# Patient Record
Sex: Male | Born: 1950
Health system: Southern US, Community
[De-identification: ages and names within clinical notes are randomized; demographics above are authoritative.]

## PROBLEM LIST (undated history)

## (undated) DIAGNOSIS — E785 Hyperlipidemia, unspecified: Secondary | ICD-10-CM

## (undated) DIAGNOSIS — F1020 Alcohol dependence, uncomplicated: Secondary | ICD-10-CM

## (undated) DIAGNOSIS — K635 Polyp of colon: Secondary | ICD-10-CM

## (undated) DIAGNOSIS — T7840XA Allergy, unspecified, initial encounter: Secondary | ICD-10-CM

## (undated) DIAGNOSIS — Z8619 Personal history of other infectious and parasitic diseases: Secondary | ICD-10-CM

## (undated) HISTORY — DX: Allergy, unspecified, initial encounter: T78.40XA

## (undated) HISTORY — PX: WISDOM TOOTH EXTRACTION: SHX21

## (undated) HISTORY — PX: HERNIA REPAIR: SHX51

## (undated) HISTORY — DX: Polyp of colon: K63.5

## (undated) HISTORY — DX: Alcohol dependence, uncomplicated: F10.20

## (undated) HISTORY — DX: Hyperlipidemia, unspecified: E78.5

## (undated) HISTORY — DX: Personal history of other infectious and parasitic diseases: Z86.19

---

## 2017-06-02 ENCOUNTER — Encounter: Payer: Self-pay | Admitting: Family Medicine

## 2017-06-02 ENCOUNTER — Ambulatory Visit (INDEPENDENT_AMBULATORY_CARE_PROVIDER_SITE_OTHER): Payer: Medicare PPO | Admitting: Family Medicine

## 2017-06-02 VITALS — BP 110/56 | HR 76 | Temp 97.7°F | Ht 69.75 in | Wt 169.6 lb

## 2017-06-02 DIAGNOSIS — M19042 Primary osteoarthritis, left hand: Secondary | ICD-10-CM

## 2017-06-02 DIAGNOSIS — M19041 Primary osteoarthritis, right hand: Secondary | ICD-10-CM

## 2017-06-02 DIAGNOSIS — E785 Hyperlipidemia, unspecified: Secondary | ICD-10-CM

## 2017-06-02 NOTE — Patient Instructions (Addendum)
When your hands get particularly painful, Ibuprofen 400-600 mg (2-3 over the counter strength tabs) every 6 hours as needed for pain.   There are options for your hands. Let us know if you are interested in these.

## 2017-06-02 NOTE — Progress Notes (Signed)
Chief Complaint  Patient presents with  . Establish Care       New Patient Visit SUBJECTIVE: HPI: Ray Stein is an 66 y.o.male who is being seen for establishing care.  The patient was previously seen at office in [place].  Sober since 1983. Former smoker, quit 5 mo ago. Smoked 1 cigar daily for 3 years. His last tetanus shot was 08/08/08. His last colonoscopy was in 2013. He did have some benign polyps found in 2003 and routine follow-up was suggested. He had the shingles vaccine (Zostavax) in 2014. He has a history of hyperlipidemia and used to be on Crestor. He has not had labs since 2016. He does have those labs with him today. His diet is fair. The patient currently takes no medications routinely. He has not had any pneumonia vaccinations.  He does have a history of pain in both of his hands. His third and fourth finger do not extend fully on the right or left, worse on the right. He takes ibuprofen intermittently for this that does seem to help somewhat.  Allergies  Allergen Reactions  . Penicillins Rash    Past Medical History:  Diagnosis Date  . Alcohol addiction (HCC)   . Allergy   . Colon polyps    Benign, next colonoscopy due in 2023  . History of chicken pox   . Hyperlipidemia    Past Surgical History:  Procedure Laterality Date  . HERNIA REPAIR    . WISDOM TOOTH EXTRACTION     Social History   Social History  . Marital status: Married   Social History Main Topics  . Smoking status: Former Smoker    Types: Cigars    Quit date: 12/02/2016  . Smokeless tobacco: Former NeurosurgeonUser    Types: Snuff    Quit date: 12/02/2016  . Alcohol use No  . Drug use: No   Family History  Problem Relation Age of Onset  . Arthritis Mother   . Diabetes Mother   . Diabetes Father   . Cancer Maternal Grandfather        Colon    Takes no medications routinely.  ROS MSK: +hand pain   OBJECTIVE: BP (!) 110/56 (BP Location: Left Arm, Patient Position: Sitting, Cuff Size:  Normal)   Pulse 76   Temp 97.7 F (36.5 C) (Oral)   Ht 5' 9.75" (1.772 m)   Wt 169 lb 9.6 oz (76.9 kg)   SpO2 96%   BMI 24.51 kg/m   Constitutional: -  VS reviewed -  Well developed, well nourished, appears stated age -  No apparent distress  Psychiatric: -  Oriented to person, place, and time -  Memory intact -  Affect and mood normal -  Fluent conversation, good eye contact -  Judgment and insight age appropriate  Eye: -  Conjunctivae clear, no discharge -  Pupils symmetric, round, reactive to light  ENMT: -  Ears are patent b/l without erythema or discharge. TM's are shiny and clear b/l without evidence of effusion or infection. -  Oral mucosa without lesions, tongue and uvula midline    Tonsils not enlarged, no erythema, no exudate, trachea midline    Pharynx moist, no lesions, no erythema  Neck: -  No gross swelling, no palpable masses -  Thyroid midline, not enlarged, mobile, no palpable masses  Cardiovascular: -  RRR, no murmurs -  No LE edema  Respiratory: -  Normal respiratory effort, no accessory muscle use, no retraction -  Breath sounds equal,  no wheezes, no ronchi, no crackles  Gastrointestinal: -  Bowel sounds normal -  No tenderness, no distention, no guarding, no masses  Neurological:  -  CN II - XII grossly intact -  Sensation grossly intact to light touch, equal bilaterally  Musculoskeletal: -  PIP and DIP joint hypertrophy of b/l hands -  Contractures of flexor tendons of 3rd and 4th digits on R and L hand, painful when extending the digits  Skin: -  No significant lesion on inspection -  Warm and dry to palpation   ASSESSMENT/PLAN: Hyperlipidemia, unspecified hyperlipidemia type  Arthritis of both hands  Patient instructed to sign release of records form from his previous PCP. F/u at earliest convenience for CPE. He will need routine lab work, PCV 13, and Shingrix. Discussed options regarding his hands including doing nothing, occupational therapy,  and referral to hand surgery. He opted for the initial option for now. He will let us know if things change. The patient voiced understanding and agreement to the plan.   Jilda Roche [place], DO 06/02/17  5:30 PM

## 2017-06-30 ENCOUNTER — Encounter: Payer: Self-pay | Admitting: Family Medicine

## 2017-06-30 ENCOUNTER — Ambulatory Visit (INDEPENDENT_AMBULATORY_CARE_PROVIDER_SITE_OTHER): Payer: Medicare PPO | Admitting: Family Medicine

## 2017-06-30 VITALS — BP 124/70 | HR 68 | Temp 97.8°F | Ht 70.0 in | Wt 168.2 lb

## 2017-06-30 DIAGNOSIS — Z1159 Encounter for screening for other viral diseases: Secondary | ICD-10-CM | POA: Diagnosis not present

## 2017-06-30 DIAGNOSIS — Z114 Encounter for screening for human immunodeficiency virus [HIV]: Secondary | ICD-10-CM

## 2017-06-30 DIAGNOSIS — Z23 Encounter for immunization: Secondary | ICD-10-CM

## 2017-06-30 DIAGNOSIS — Z0000 Encounter for general adult medical examination without abnormal findings: Secondary | ICD-10-CM | POA: Diagnosis not present

## 2017-06-30 DIAGNOSIS — Z125 Encounter for screening for malignant neoplasm of prostate: Secondary | ICD-10-CM | POA: Diagnosis not present

## 2017-06-30 NOTE — Progress Notes (Signed)
Pre visit review using our clinic review tool, if applicable. No additional management support is needed unless otherwise documented below in the visit note. 

## 2017-06-30 NOTE — Progress Notes (Signed)
Chief Complaint  Patient presents with  . Annual Exam    Well Male Elyn PeersDouglas Moraes is here for a complete physical.   His last physical was >1 year ago.  Current diet: in general, a "healthy" diet   Current exercise: walking daily Weight trend: stable Does pt snore? No. Daytime fatigue? No. Seat belt? Yes.    Health maintenance Shingrix- No Colonoscopy- Yes - due in 5 years Tetanus- Yes Hep C- No Prostate cancer screening- Yes Pneumonia vaccine- No  Past Medical History:  Diagnosis Date  . Alcohol addiction (HCC)   . Allergy   . Colon polyps    Benign, next colonoscopy due in 2023  . History of chicken pox   . Hyperlipidemia     Past Surgical History:  Procedure Laterality Date  . HERNIA REPAIR    . WISDOM TOOTH EXTRACTION     Medications  Takes no medications routinely.    Allergies Allergies  Allergen Reactions  . Penicillins Rash   Family History Family History  Problem Relation Age of Onset  . Arthritis Mother   . Diabetes Mother   . Diabetes Father   . Cancer Maternal Grandfather        Colon    Review of Systems: Constitutional:  no unexpected change in weight, no fevers or chills Eye:  no recent significant change in vision Ear/Nose/Mouth/Throat:  Ears:  no tinnitus or hearing loss Nose/Mouth/Throat:  no complaints of nasal congestion or bleeding, no sore throat and oral sores Cardiovascular:  no chest pain, no palpitations Respiratory:  no cough and no shortness of breath Gastrointestinal:  no abdominal pain, no change in bowel habits, no nausea, vomiting, diarrhea, or constipation and no black or bloody stool GU:  Male: negative for dysuria, frequency, and incontinence and negative for prostate symptoms Musculoskeletal/Extremities: +intermittent LBP, otherwise no pain, redness, or swelling of the joints Integumentary (Skin):  no abnormal skin lesions reported Neurologic:  no headaches, no numbness, tingling Endocrine:  No weight changes,  masses in the neck, heat/cold intolerance, bowel or skin changes, or cardiovascular system symptoms Hematologic/Lymphatic:  no abnormal bleeding, no night sweats, no weight loss  Exam BP 124/70 (BP Location: Left Arm, Patient Position: Sitting, Cuff Size: Normal)   Pulse 68   Temp 97.8 F (36.6 C) (Oral)   Ht 5\' 10"  (1.778 m)   Wt 168 lb 4 oz (76.3 kg)   SpO2 97%   BMI 24.14 kg/m  General:  well developed, well nourished, in no apparent distress Skin:  no significant moles, warts, or growths Head:  no masses, lesions, or tenderness Eyes:  pupils equal and round, sclera anicteric without injection Ears:  canals without lesions, TMs shiny without retraction, no obvious effusion, no erythema Nose:  nares patent, septum midline, mucosa normal Throat/Pharynx:  lips and gingiva without lesion; tongue and uvula midline; non-inflamed pharynx; no exudates or postnasal drainage Neck: neck supple without adenopathy, thyromegaly, or masses Lungs:  clear to auscultation, breath sounds equal bilaterally, no respiratory distress Cardio:  regular rate and rhythm without murmurs, heart sounds without clicks or rubs Abdomen:  abdomen soft, nontender; bowel sounds normal; no masses or organomegaly Genital (male): circumcised penis, no lesions or discharge; testes present bilaterally without masses or tenderness Rectal: Deferred Musculoskeletal:  symmetrical muscle groups noted without atrophy or deformity Extremities:  no clubbing, cyanosis, or edema, no deformities, no skin discoloration Neuro:  gait normal; deep tendon reflexes normal and symmetric Psych: well oriented with normal range of affect and appropriate  judgment/insight  Assessment and Plan  Well adult exam - Plan: CBC, Lipid panel, Comprehensive metabolic panel  Encounter for screening for HIV - Plan: HIV antibody  Need for hepatitis C screening test - Plan: Hepatitis C antibody  Screening for prostate cancer - Plan: PSA   Well 66  y.o. male. Great work fasting this entire day. Counseled on diet and exercise. Challenged him to lift weights. He will check on the AAA screening as he may have had it in [place]. Other orders as above. Home stretches and exercises given for low back pain. It is not bothering him much right now.  Follow up in 6 mo for AWV, 1 yr with me or prn, pending above.  The patient voiced understanding and agreement to the plan.  Jilda Roche [place], DO 06/30/17 3:13 PM

## 2017-06-30 NOTE — Addendum Note (Signed)
Addended by: Scharlene GlossEWING, Kadeshia Kasparian B on: 06/30/2017 03:26 PM   Modules accepted: Orders

## 2017-06-30 NOTE — Patient Instructions (Signed)
EXERCISES  RANGE OF MOTION (ROM) AND STRETCHING EXERCISES - Low Back Sprain Most people with lower back pain will find that their symptoms get worse with excessive bending forward (flexion) or arching at the lower back (extension). The exercises that will help resolve your symptoms will focus on the opposite motion.  Your physician, physical therapist or athletic trainer will help you determine which exercises will be most helpful to resolve your lower back pain. Do not complete any exercises without first consulting with your caregiver. Discontinue any exercises which make your symptoms worse, until you speak to your caregiver. If you have pain, numbness or tingling which travels down into your buttocks, leg or foot, the goal of the therapy is for these symptoms to move closer to your back and eventually resolve. Sometimes, these leg symptoms will get better, but your lower back pain may worsen. This is often an indication of progress in your rehabilitation. Be very alert to any changes in your symptoms and the activities in which you participated in the 24 hours prior to the change. Sharing this information with your caregiver will allow him or her to most efficiently treat your condition. These exercises may help you when beginning to rehabilitate your injury. Your symptoms may resolve with or without further involvement from your physician, physical therapist or athletic trainer. While completing these exercises, remember:   Restoring tissue flexibility helps normal motion to return to the joints. This allows healthier, less painful movement and activity.  An effective stretch should be held for at least 30 seconds.  A stretch should never be painful. You should only feel a gentle lengthening or release in the stretched tissue. FLEXION RANGE OF MOTION AND STRETCHING EXERCISES:  STRETCH - Flexion, Single Knee to Chest   Lie on a firm bed or floor with both legs extended in front of you.  Keeping  one leg in contact with the floor, bring your opposite knee to your chest. Hold your leg in place by either grabbing behind your thigh or at your knee.  Pull until you feel a gentle stretch in your low back. Hold 15-20 seconds.  Slowly release your grasp and repeat the exercise with the opposite side. Repeat 2 times. Complete this exercise 1-2 times per day.   STRETCH - Flexion, Double Knee to Chest  Lie on a firm bed or floor with both legs extended in front of you.  Keeping one leg in contact with the floor, bring your opposite knee to your chest.  Tense your stomach muscles to support your back and then lift your other knee to your chest. Hold your legs in place by either grabbing behind your thighs or at your knees.  Pull both knees toward your chest until you feel a gentle stretch in your low back. Hold 15-20 seconds.  Tense your stomach muscles and slowly return one leg at a time to the floor. Repeat 2 times. Complete this exercise 1-2 times per day.   STRETCH - Low Trunk Rotation  Lie on a firm bed or floor. Keeping your legs in front of you, bend your knees so they are both pointed toward the ceiling and your feet are flat on the floor.  Extend your arms out to the side. This will stabilize your upper body by keeping your shoulders in contact with the floor.  Gently and slowly drop both knees together to one side until you feel a gentle stretch in your low back. Hold for 15-20 seconds.  Tense your   stomach muscles to support your lower back as you bring your knees back to the starting position. Repeat the exercise to the other side. Repeat 2 times. Complete this exercise 1-2 times per day  EXTENSION RANGE OF MOTION AND FLEXIBILITY EXERCISES:  STRETCH - Extension, Prone on Elbows   Lie on your stomach on the floor, a bed will be too soft. Place your palms about shoulder width apart and at the height of your head.  Place your elbows under your shoulders. If this is too  painful, stack pillows under your chest.  Allow your body to relax so that your hips drop lower and make contact more completely with the floor.  Hold this position for 15-20 seconds.  Slowly return to lying flat on the floor. Repeat 2 times. Complete this exercise 1-2 times per day.   RANGE OF MOTION - Extension, Prone Press Ups  Lie on your stomach on the floor, a bed will be too soft. Place your palms about shoulder width apart and at the height of your head.  Keeping your back as relaxed as possible, slowly straighten your elbows while keeping your hips on the floor. You may adjust the placement of your hands to maximize your comfort. As you gain motion, your hands will come more underneath your shoulders.  Hold this position 15-20 seconds.  Slowly return to lying flat on the floor. Repeat 2 times. Complete this exercise 1-2 times per day.   RANGE OF MOTION- Quadruped, Neutral Spine   Assume a hands and knees position on a firm surface. Keep your hands under your shoulders and your knees under your hips. You may place padding under your knees for comfort.  Drop your head and point your tailbone toward the ground below you. This will round out your lower back like an angry cat. Hold this position for 15-20 seconds.  Slowly lift your head and release your tail bone so that your back sags into a large arch, like an old horse.  Hold this position for 15-20 seconds.  Repeat this until you feel limber in your low back.  Now, find your "sweet spot." This will be the most comfortable position somewhere between the two previous positions. This is your neutral spine. Once you have found this position, tense your stomach muscles to support your low back.  Hold this position for 15-20 seconds. Repeat 2 times. Complete this exercise 1-2 times per day.  STRENGTHENING EXERCISES - Low Back Sprain These exercises may help you when beginning to rehabilitate your injury. These exercises should  be done near your "sweet spot." This is the neutral, low-back arch, somewhere between fully rounded and fully arched, that is your least painful position. When performed in this safe range of motion, these exercises can be used for people who have either a flexion or extension based injury. These exercises may resolve your symptoms with or without further involvement from your physician, physical therapist or athletic trainer. While completing these exercises, remember:   Muscles can gain both the endurance and the strength needed for everyday activities through controlled exercises.  Complete these exercises as instructed by your physician, physical therapist or athletic trainer. Increase the resistance and repetitions only as guided.  You may experience muscle soreness or fatigue, but the pain or discomfort you are trying to eliminate should never worsen during these exercises. If this pain does worsen, stop and make certain you are following the directions exactly. If the pain is still present after adjustments, discontinue the   exercise until you can discuss the trouble with your caregiver.  STRENGTHENING - Deep Abdominals, Pelvic Tilt   Lie on a firm bed or floor. Keeping your legs in front of you, bend your knees so they are both pointed toward the ceiling and your feet are flat on the floor.  Tense your lower abdominal muscles to press your low back into the floor. This motion will rotate your pelvis so that your tail bone is scooping upwards rather than pointing at your feet or into the floor. With a gentle tension and even breathing, hold this position for 10-15 seconds. Repeat 2 times. Complete this exercise 1 time per day.   STRENGTHENING - Abdominals, Crunches   Lie on a firm bed or floor. Keeping your legs in front of you, bend your knees so they are both pointed toward the ceiling and your feet are flat on the floor. Cross your arms over your chest.  Slightly tip your chin down  without bending your neck.  Tense your abdominals and slowly lift your trunk high enough to just clear your shoulder blades. Lifting higher can put excessive stress on the lower back and does not further strengthen your abdominal muscles.  Control your return to the starting position. Repeat 2 times. Complete this exercise once every 1-2 days.   STRENGTHENING - Quadruped, Opposite UE/LE Lift   Assume a hands and knees position on a firm surface. Keep your hands under your shoulders and your knees under your hips. You may place padding under your knees for comfort.  Find your neutral spine and gently tense your abdominal muscles so that you can maintain this position. Your shoulders and hips should form a rectangle that is parallel with the floor and is not twisted.  Keeping your trunk steady, lift your right hand no higher than your shoulder and then your left leg no higher than your hip. Make sure you are not holding your breath. Hold this position for 15-20 seconds.  Continuing to keep your abdominal muscles tense and your back steady, slowly return to your starting position. Repeat with the opposite arm and leg. Repeat 2 times. Complete this exercise once every 1-2 days.   STRENGTHENING - Abdominals and Quadriceps, Straight Leg Raise   Lie on a firm bed or floor with both legs extended in front of you.  Keeping one leg in contact with the floor, bend the other knee so that your foot can rest flat on the floor.  Find your neutral spine, and tense your abdominal muscles to maintain your spinal position throughout the exercise.  Slowly lift your straight leg off the floor about 6 inches for a count of 15, making sure to not hold your breath.  Still keeping your neutral spine, slowly lower your leg all the way to the floor. Repeat this exercise with each leg 2 times. Complete this exercise once every 1-2 days. POSTURE AND BODY MECHANICS CONSIDERATIONS - Low Back Sprain Keeping correct  posture when sitting, standing or completing your activities will reduce the stress put on different body tissues, allowing injured tissues a chance to heal and limiting painful experiences. The following are general guidelines for improved posture. Your physician or physical therapist will provide you with any instructions specific to your needs. While reading these guidelines, remember:  The exercises prescribed by your provider will help you have the flexibility and strength to maintain correct postures.  The correct posture provides the best environment for your joints to work. All of your   joints have less wear and tear when properly supported by a spine with good posture. This means you will experience a healthier, less painful body.  Correct posture must be practiced with all of your activities, especially prolonged sitting and standing. Correct posture is as important when doing repetitive low-stress activities (typing) as it is when doing a single heavy-load activity (lifting).  RESTING POSITIONS Consider which positions are most painful for you when choosing a resting position. If you have pain with flexion-based activities (sitting, bending, stooping, squatting), choose a position that allows you to rest in a less flexed posture. You would want to avoid curling into a fetal position on your side. If your pain worsens with extension-based activities (prolonged standing, working overhead), avoid resting in an extended position such as sleeping on your stomach. Most people will find more comfort when they rest with their spine in a more neutral position, neither too rounded nor too arched. Lying on a non-sagging bed on your side with a pillow between your knees, or on your back with a pillow under your knees will often provide some relief. Keep in mind, being in any one position for a prolonged period of time, no matter how correct your posture, can still lead to stiffness. PROPER SITTING  POSTURE In order to minimize stress and discomfort on your spine, you must sit with correct posture. Sitting with good posture should be effortless for a healthy body. Returning to good posture is a gradual process. Many people can work toward this most comfortably by using various supports until they have the flexibility and strength to maintain this posture on their own. When sitting with proper posture, your ears will fall over your shoulders and your shoulders will fall over your hips. You should use the back of the chair to support your upper back. Your lower back will be in a neutral position, just slightly arched. You may place a small pillow or folded towel at the base of your lower back for  support.  When working at a desk, create an environment that supports good, upright posture. Without extra support, muscles tire, which leads to excessive strain on joints and other tissues. Keep these recommendations in mind:  CHAIR:  A chair should be able to slide under your desk when your back makes contact with the back of the chair. This allows you to work closely.  The chair's height should allow your eyes to be level with the upper part of your monitor and your hands to be slightly lower than your elbows.  BODY POSITION  Your feet should make contact with the floor. If this is not possible, use a foot rest.  Keep your ears over your shoulders. This will reduce stress on your neck and low back.  INCORRECT SITTING POSTURES  If you are feeling tired and unable to assume a healthy sitting posture, do not slouch or slump. This puts excessive strain on your back tissues, causing more damage and pain. Healthier options include:  Using more support, like a lumbar pillow.  Switching tasks to something that requires you to be upright or walking.  Talking a brief walk.  Lying down to rest in a neutral-spine position.  PROLONGED STANDING WHILE SLIGHTLY LEANING FORWARD  When completing a task  that requires you to lean forward while standing in one place for a long time, place either foot up on a stationary 2-4 inch high object to help maintain the best posture. When both feet are on the ground,   the lower back tends to lose its slight inward curve. If this curve flattens (or becomes too large), then the back and your other joints will experience too much stress, tire more quickly, and can cause pain.  CORRECT STANDING POSTURES Proper standing posture should be assumed with all daily activities, even if they only take a few moments, like when brushing your teeth. As in sitting, your ears should fall over your shoulders and your shoulders should fall over your hips. You should keep a slight tension in your abdominal muscles to brace your spine. Your tailbone should point down to the ground, not behind your body, resulting in an over-extended swayback posture.   INCORRECT STANDING POSTURES  Common incorrect standing postures include a forward head, locked knees and/or an excessive swayback. WALKING Walk with an upright posture. Your ears, shoulders and hips should all line-up.  PROLONGED ACTIVITY IN A FLEXED POSITION When completing a task that requires you to bend forward at your waist or lean over a low surface, try to find a way to stabilize 3 out of 4 of your limbs. You can place a hand or elbow on your thigh or rest a knee on the surface you are reaching across. This will provide you more stability, so that your muscles do not tire as quickly. By keeping your knees relaxed, or slightly bent, you will also reduce stress across your lower back. CORRECT LIFTING TECHNIQUES  DO :  Assume a wide stance. This will provide you more stability and the opportunity to get as close as possible to the object which you are lifting.  Tense your abdominals to brace your spine. Bend at the knees and hips. Keeping your back locked in a neutral-spine position, lift using your leg muscles. Lift with your  legs, keeping your back straight.  Test the weight of unknown objects before attempting to lift them.  Try to keep your elbows locked down at your sides in order get the best strength from your shoulders when carrying an object.     Always ask for help when lifting heavy or awkward objects. INCORRECT LIFTING TECHNIQUES DO NOT:   Lock your knees when lifting, even if it is a small object.  Bend and twist. Pivot at your feet or move your feet when needing to change directions.  Assume that you can safely pick up even a paperclip without proper posture.   

## 2017-07-01 LAB — CBC
HCT: 43 % (ref 39.0–52.0)
Hemoglobin: 14.6 g/dL (ref 13.0–17.0)
MCHC: 34 g/dL (ref 30.0–36.0)
MCV: 87.5 fl (ref 78.0–100.0)
PLATELETS: 318 10*3/uL (ref 150.0–400.0)
RBC: 4.91 Mil/uL (ref 4.22–5.81)
RDW: 14 % (ref 11.5–15.5)
WBC: 8.1 10*3/uL (ref 4.0–10.5)

## 2017-07-01 LAB — LIPID PANEL
CHOL/HDL RATIO: 6
CHOLESTEROL: 262 mg/dL — AB (ref 0–200)
HDL: 44.1 mg/dL (ref >39.00)
LDL Cholesterol: 187 mg/dL — ABNORMAL HIGH (ref 0–99)
NonHDL: 217.84
TRIGLYCERIDES: 152 mg/dL — AB (ref 0.0–149.0)
VLDL: 30.4 mg/dL (ref 0.0–40.0)

## 2017-07-01 LAB — COMPREHENSIVE METABOLIC PANEL
ALBUMIN: 4.2 g/dL (ref 3.5–5.2)
ALK PHOS: 68 U/L (ref 39–117)
ALT: 24 U/L (ref 0–53)
AST: 29 U/L (ref 0–37)
BILIRUBIN TOTAL: 0.5 mg/dL (ref 0.2–1.2)
BUN: 15 mg/dL (ref 6–23)
CALCIUM: 9.6 mg/dL (ref 8.4–10.5)
CO2: 28 meq/L (ref 19–32)
CREATININE: 1.01 mg/dL (ref 0.40–1.50)
Chloride: 102 mEq/L (ref 96–112)
GFR: 78.73 mL/min (ref >60.00)
Glucose, Bld: 76 mg/dL (ref 70–99)
Potassium: 3.8 mEq/L (ref 3.5–5.1)
Sodium: 137 mEq/L (ref 135–145)
TOTAL PROTEIN: 7.2 g/dL (ref 6.0–8.3)

## 2017-07-01 LAB — HIV ANTIBODY (ROUTINE TESTING W REFLEX): HIV: NONREACTIVE

## 2017-07-01 LAB — HEPATITIS C ANTIBODY: HCV AB: NEGATIVE

## 2017-07-01 LAB — PSA: PSA: 0.49 ng/mL (ref 0.10–4.00)

## 2017-07-03 ENCOUNTER — Other Ambulatory Visit: Payer: Self-pay | Admitting: Family Medicine

## 2017-07-03 MED ORDER — ATORVASTATIN CALCIUM 40 MG PO TABS: 40.0000 mg | ORAL_TABLET | Freq: Every day | ORAL | 1 refills | Status: DC

## 2017-07-03 NOTE — Progress Notes (Signed)
Lipitor sent to pharmacy.

## 2017-09-29 ENCOUNTER — Ambulatory Visit: Payer: Self-pay | Admitting: Family Medicine

## 2017-10-01 ENCOUNTER — Encounter: Payer: Self-pay | Admitting: Family Medicine

## 2017-10-01 ENCOUNTER — Ambulatory Visit (INDEPENDENT_AMBULATORY_CARE_PROVIDER_SITE_OTHER): Payer: Medicare PPO | Admitting: Family Medicine

## 2017-10-01 VITALS — BP 120/64 | HR 60 | Temp 97.8°F | Ht 70.0 in | Wt 172.2 lb

## 2017-10-01 DIAGNOSIS — E785 Hyperlipidemia, unspecified: Secondary | ICD-10-CM | POA: Diagnosis not present

## 2017-10-01 DIAGNOSIS — Z23 Encounter for immunization: Secondary | ICD-10-CM

## 2017-10-01 NOTE — Progress Notes (Signed)
Chief Complaint  Patient presents with  . Hyperlipidemia    Subjective: Dyslipidemia Patient presents for dyslipidemia follow up. Compliance with treatment thus far has been good. He denies myalgias. He does not use medications that may worsen dyslipidemias (corticosteroids, progestins, anabolic steroids, diuretics, beta-blockers, amiodarone, cyclosporine, olanzapine). He is adhering to a healthy diet. The patient exercises walking every day.  The patient is not known to have coexisting coronary artery disease.  ROS: Heart: Denies chest pain or palpitations Lungs: Denies SOB or cough  Family History  Problem Relation Age of Onset  . Arthritis Mother   . Diabetes Mother   . Diabetes Father   . Cancer Maternal Grandfather        Colon   Past Medical History:  Diagnosis Date  . Alcohol addiction (HCC)   . Allergy   . Colon polyps    Benign, next colonoscopy due in 2023  . History of chicken pox   . Hyperlipidemia    Allergies  Allergen Reactions  . Penicillins Rash    Current Outpatient Prescriptions:  .  atorvastatin (LIPITOR) 40 MG tablet, Take 1 tablet (40 mg total) by mouth daily., Disp: 90 tablet, Rfl: 1  Objective: BP 120/64 (BP Location: Right Arm, Patient Position: Sitting, Cuff Size: Normal)   Pulse 60   Temp 97.8 F (36.6 C) (Oral)   Ht  (1.778 m)   Wt 172 lb 4 oz (78.1 kg)   SpO2 95%   BMI 24.72 kg/m  General: Awake, appears stated age HEENT: MMM, EOMi Heart: RRR, no bruits Lungs: CTAB, no rales, wheezes or rhonchi. No accessory muscle use Psych: Age appropriate judgment and insight, normal affect and mood  Assessment and Plan: Hyperlipidemia, unspecified hyperlipidemia type - Plan: Lipid panel, Hepatic function panel  Need for influenza vaccination - Plan: Flu vaccine HIGH DOSE PF (Fluzone High dose)  Orders as above. Appears to be doing well.  Counseled on diet and exercise, though it appears he may be fighting against genetics. Cont  on Lipitor.  F/u as originally scheduled in 9 mo. The patient voiced understanding and agreement to the plan.  Jilda Roche [place], DO 10/01/17  3:05 PM

## 2017-10-01 NOTE — Progress Notes (Signed)
Pre visit review using our clinic review tool, if applicable. No additional management support is needed unless otherwise documented below in the visit note. 

## 2017-10-01 NOTE — Patient Instructions (Addendum)
Give [place] 2-3 business days to get the results of your labs back. If things look good, I will see you in 9 mo as originally scheduled. We are here if you need [place] in the meantime.  Let us know if you need anything.

## 2017-10-02 ENCOUNTER — Other Ambulatory Visit: Payer: Self-pay | Admitting: Family Medicine

## 2017-10-02 DIAGNOSIS — R7401 Elevation of levels of liver transaminase levels: Secondary | ICD-10-CM

## 2017-10-02 DIAGNOSIS — R740 Nonspecific elevation of levels of transaminase and lactic acid dehydrogenase [LDH]: Principal | ICD-10-CM

## 2017-10-02 LAB — LIPID PANEL
CHOLESTEROL: 151 mg/dL (ref 0–200)
HDL: 44.3 mg/dL (ref >39.00)
LDL Cholesterol: 87 mg/dL (ref 0–99)
NONHDL: 106.53
Total CHOL/HDL Ratio: 3
Triglycerides: 96 mg/dL (ref 0.0–149.0)
VLDL: 19.2 mg/dL (ref 0.0–40.0)

## 2017-10-02 LAB — HEPATIC FUNCTION PANEL
ALK PHOS: 93 U/L (ref 39–117)
ALT: 49 U/L (ref 0–53)
AST: 39 U/L — AB (ref 0–37)
Albumin: 4 g/dL (ref 3.5–5.2)
BILIRUBIN TOTAL: 0.5 mg/dL (ref 0.2–1.2)
Bilirubin, Direct: 0.1 mg/dL (ref 0.0–0.3)
Total Protein: 6.8 g/dL (ref 6.0–8.3)

## 2017-10-06 ENCOUNTER — Other Ambulatory Visit (INDEPENDENT_AMBULATORY_CARE_PROVIDER_SITE_OTHER): Payer: Medicare PPO

## 2017-10-06 DIAGNOSIS — R740 Nonspecific elevation of levels of transaminase and lactic acid dehydrogenase [LDH]: Secondary | ICD-10-CM

## 2017-10-06 DIAGNOSIS — R7401 Elevation of levels of liver transaminase levels: Secondary | ICD-10-CM

## 2017-10-06 LAB — HEPATIC FUNCTION PANEL
ALT: 36 U/L (ref 0–53)
AST: 28 U/L (ref 0–37)
Albumin: 4 g/dL (ref 3.5–5.2)
Alkaline Phosphatase: 87 U/L (ref 39–117)
BILIRUBIN DIRECT: 0.1 mg/dL (ref 0.0–0.3)
TOTAL PROTEIN: 6.8 g/dL (ref 6.0–8.3)
Total Bilirubin: 0.4 mg/dL (ref 0.2–1.2)

## 2017-12-10 NOTE — Progress Notes (Signed)
Subjective:   Ray Stein is a 66 y.o. male who presents for an Initial Medicare Annual Wellness Visit. 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  Review of Systems  No ROS.  Medicare Wellness Visit. Additional risk factors are reflected in the social history. Cardiac Risk Factors include: advanced age (>62men, >15 women) Sleep patterns: Sleeps 6-8 hrs per night. Mostly feels rested.  Home Safety/Smoke Alarms: Feels safe in home. Smoke alarms in place. Lives with wife and dog.  Male:   [facility]- pt reports last done in [place] in 2015. Reports normal w/ recall 5 yrs. Family hx of colon cancer.     PSA-  Lab Results  Component Value Date   PSA 0.49 06/30/2017      Objective:    Today's Vitals   12/12/17 1417  BP: (!) 102/58  Pulse: 66  SpO2: 96%  Weight: 169 lb 9.6 oz (76.9 kg)  Height: 5\' 10"  (1.778 m)   Body mass index is 24.34 kg/m.  Advanced Directives 12/12/2017  Does Patient Have a Medical Advance Directive? No    Current Medications (verified) Outpatient Encounter Medications as of 12/12/2017  Medication Sig  . atorvastatin (LIPITOR) 40 MG tablet Take 1 tablet (40 mg total) by mouth daily.   No facility-administered encounter medications on file as of 12/12/2017.     Allergies (verified) Penicillins   History: Past Medical History:  Diagnosis Date  . Alcohol addiction (HCC)   . Allergy   . Colon polyps    Benign, next colonoscopy due in 2023  . History of chicken pox   . Hyperlipidemia    Past Surgical History:  Procedure Laterality Date  . HERNIA REPAIR    . WISDOM TOOTH EXTRACTION     Family History  Problem Relation Age of Onset  . Arthritis Mother   . Diabetes Mother   . Diabetes Father   . Cancer Maternal Grandfather        Colon   Social History   Socioeconomic History  . Marital status: Married    Spouse name: None  . Number of children: None  . Years of education: None  . Highest education level: None  Social  Needs  . Financial resource strain: None  . Food insecurity - worry: None  . Food insecurity - inability: None  . Transportation needs - medical: None  . Transportation needs - non-medical: None  Occupational History  . None  Tobacco Use  . Smoking status: Current Some Day Smoker    Types: Cigars    Start date: 08/2014    Last attempt to quit: 12/30/2016    Years since quitting: 0.9  . Smokeless tobacco: Former Neurosurgeon    Types: Snuff    Quit date: 12/02/2016  Substance and Sexual Activity  . Alcohol use: No  . Drug use: No  . Sexual activity: Yes  Other Topics Concern  . None  Social History Narrative  . None   Tobacco Counseling Ready to quit: Not Answered Counseling given: Not Answered    Activities of Daily Living In your present state of health, do you have any difficulty performing the following activities: 12/12/2017 06/02/2017  Hearing? N N  Vision? N N  Comment wearing glasses. Goes to eye doctor as needed. -  Difficulty concentrating or making decisions? N N  Walking or climbing stairs? N N  Dressing or bathing? N N  Doing errands, shopping? N N  Preparing Food and eating ? N -  Using the Toilet? N -  In the past six months, have you accidently leaked urine? N -  Do you have problems with loss of bowel control? N -  Managing your Medications? N -  Managing your Finances? N -  Housekeeping or managing your Housekeeping? N -  Some recent data might be hidden      Immunizations and Health Maintenance Immunization History  Administered Date(s) Administered  . Hep A / Hep B 03/12/2004, 04/19/2004  . Hepatitis A, Adult 09/12/2004  . Influenza, High Dose Seasonal PF 10/01/2017  . Influenza-Unspecified 11/13/2011, 09/13/2013, 10/30/2016  . Pneumococcal Conjugate-13 06/30/2017  . Td 12/31/1991  . Tdap 08/08/2008  . Zoster 11/03/2015   There are no preventive care reminders to display for this patient.  Patient Care Team: Sharlene DoryWendling, Nicholas Paul, DO as PCP -  General (Family Medicine)  Indicate any recent Medical Services you may have received from other than [facility] providers in the past year (date may be approximate).    Assessment:   This is a routine wellness examination for Ray Stein. Physical assessment deferred to PCP.  Hearing/Vision screen Hearing Screening Comments: Able to hear conversational tones w/o difficulty. No issues reported.  Passed whisper test Vision Screening Comments: Pt reports eye exam every 2 yrs. No issues reported   Dietary issues and exercise activities discussed: Current Exercise Habits: Home exercise routine, Type of exercise: walking, Time (Minutes): 30, Frequency (Times/Week): 7, Weekly Exercise (Minutes/Week): 210  Goals    . Maintain current health (pt-stated)      Depression Screen PHQ 2/9 Scores 12/12/2017 06/02/2017  PHQ - 2 Score 0 0  PHQ- 9 Score - 2    Fall Risk Fall Risk  12/12/2017 06/02/2017  Falls in the past year? Yes No  Comment Slipped on the ice. -  Number falls in past yr: 1 -  Injury with Fall? No -    Cognitive Function: MMSE - Mini Mental State Exam 12/12/2017  Orientation to time 5  Orientation to Place 5  Registration 3  Attention/ Calculation 5  Recall 0  Language- name 2 objects 2  Language- repeat 1  Language- follow 3 step command 3  Language- read & follow direction 1  Write a sentence 1  Copy design 1  Total score 27        Screening Tests Health Maintenance  Topic Date Due  . PNA vac Low Risk Adult (2 of 2 - PPSV23) 06/30/2018  . TETANUS/TDAP  08/08/2018  . COLONOSCOPY  12/31/2023  . INFLUENZA VACCINE  Completed  . Hepatitis C Screening  Completed  . HIV Screening  Completed      Plan:   Follow up with Dr.Wendling as scheduled 07/03/18.   Continue to eat heart healthy diet (full of fruits, vegetables, whole grains, lean protein, water--limit salt, fat, and sugar intake) and increase physical activity as tolerated.  Continue doing brain stimulating  activities (puzzles, reading, adult coloring books, staying active) to keep memory sharp.   Bring a copy of your living will and/or healthcare power of attorney to your next office visit.  I have personally reviewed and noted the following in the patient's chart:   . Medical and social history . Use of alcohol, tobacco or illicit drugs  . Current medications and supplements . Functional ability and status . Nutritional status . Physical activity . Advanced directives . List of other physicians . Hospitalizations, surgeries, and ER visits in previous 12 months . Vitals . Screenings to include cognitive, depression,  and falls . Referrals and appointments  In addition, I have reviewed and discussed with patient certain preventive protocols, quality metrics, and best practice recommendations. A written personalized care plan for preventive services as well as general preventive health recommendations were provided to patient.     Avon GullyBritt, Guilherme Schwenke Angel, [place]   12/12/2017

## 2017-12-12 ENCOUNTER — Ambulatory Visit (INDEPENDENT_AMBULATORY_CARE_PROVIDER_SITE_OTHER): Payer: Medicare PPO | Admitting: *Deleted

## 2017-12-12 ENCOUNTER — Encounter: Payer: Self-pay | Admitting: *Deleted

## 2017-12-12 VITALS — BP 102/58 | HR 66 | Ht 70.0 in | Wt 169.6 lb

## 2017-12-12 DIAGNOSIS — Z0000 Encounter for general adult medical examination without abnormal findings: Secondary | ICD-10-CM

## 2017-12-12 NOTE — Progress Notes (Signed)
Noted. Agree with above.  Jilda Rocheicholas Paul BayardWendling, DO 12/12/17 3:09 PM

## 2017-12-12 NOTE — Patient Instructions (Signed)
Follow up with Dr.Wendling as scheduled 07/03/18.   Continue to eat heart healthy diet (full of fruits, vegetables, whole grains, lean protein, water--limit salt, fat, and sugar intake) and increase physical activity as tolerated.  Continue doing brain stimulating activities (puzzles, reading, adult coloring books, staying active) to keep memory sharp.   Bring a copy of your living will and/or healthcare power of attorney to your next office visit.   [name] , Thank you for taking time to come for your Medicare Wellness Visit. I appreciate your ongoing commitment to your health goals. Please review the following plan we discussed and let me know if I can assist you in the future.   These are the goals we discussed: Goals    . Maintain current health (pt-stated)       This is a list of the screening recommended for you and due dates:  Health Maintenance  Topic Date Due  . Pneumonia vaccines (2 of 2 - PPSV23) 06/30/2018  . Tetanus Vaccine  08/08/2018  . Colon Cancer Screening  12/31/2023  . Flu Shot  Completed  .  Hepatitis C: One time screening is recommended by Center for Disease Control  (CDC) for  adults born from [birth year] through 1965.   Completed  . HIV Screening  Completed    Health Maintenance, Male A healthy lifestyle and preventive care is important for your health and wellness. Ask your health care provider about what schedule of regular examinations is right for you. What should I know about weight and diet? Eat a Healthy Diet  Eat plenty of vegetables, fruits, whole grains, low-fat dairy products, and lean protein.  Do not eat a lot of foods high in solid fats, added sugars, or salt.  Maintain a Healthy Weight Regular exercise can help you achieve or maintain a healthy weight. You should:  Do at least 150 minutes of exercise each week. The exercise should increase your heart rate and make you sweat (moderate-intensity exercise).  Do strength-training exercises at  least twice a week.  Watch Your Levels of Cholesterol and Blood Lipids  Have your blood tested for lipids and cholesterol every 5 years starting at 65 years of age. If you are at high risk for heart disease, you should start having your blood tested when you are 65 years old. You may need to have your cholesterol levels checked more often if: ? Your lipid or cholesterol levels are high. ? You are older than 65 years of age. ? You are at high risk for heart disease.  What should I know about cancer screening? Many types of cancers can be detected early and may often be prevented. Lung Cancer  You should be screened every year for lung cancer if: ? You are a current smoker who has smoked for at least 30 years. ? You are a former smoker who has quit within the past 15 years.  Talk to your health care provider about your screening options, when you should start screening, and how often you should be screened.  Colorectal Cancer  Routine colorectal cancer screening usually begins at 65 years of age and should be repeated every 5-10 years until you are 65 years old. You may need to be screened more often if early forms of precancerous polyps or small growths are found. Your health care provider may recommend screening at an earlier age if you have risk factors for colon cancer.  Your health care provider may recommend using home test kits to check  for hidden blood in the stool.  A small camera at the end of a tube can be used to examine your colon (sigmoidoscopy or colonoscopy). This checks for the earliest forms of colorectal cancer.  Prostate and Testicular Cancer  Depending on your age and overall health, your health care provider may do certain tests to screen for prostate and testicular cancer.  Talk to your health care provider about any symptoms or concerns you have about testicular or prostate cancer.  Skin Cancer  Check your skin from head to toe regularly.  Tell your health  care provider about any new moles or changes in moles, especially if: ? There is a change in a mole's size, shape, or color. ? You have a mole that is larger than a pencil eraser.  Always use sunscreen. Apply sunscreen liberally and repeat throughout the day.  Protect yourself by wearing long sleeves, pants, a wide-brimmed hat, and sunglasses when outside.  What should I know about heart disease, diabetes, and high blood pressure?  If you are 6918-539 years of age, have your blood pressure checked every 3-5 years. If you are 340 years of age or older, have your blood pressure checked every year. You should have your blood pressure measured twice-once when you are at a hospital or clinic, and once when you are not at a hospital or clinic. Record the average of the two measurements. To check your blood pressure when you are not at a hospital or clinic, you can use: ? An automated blood pressure machine at a pharmacy. ? A home blood pressure monitor.  Talk to your health care provider about your target blood pressure.  If you are between 2945-65 years old, ask your health care provider if you should take aspirin to prevent heart disease.  Have regular diabetes screenings by checking your fasting blood sugar level. ? If you are at a normal weight and have a low risk for diabetes, have this test once every three years after the age of 65. ? If you are overweight and have a high risk for diabetes, consider being tested at a younger age or more often.  A one-time screening for abdominal aortic aneurysm (AAA) by ultrasound is recommended for men aged 65-75 years who are current or former smokers. What should I know about preventing infection? Hepatitis B If you have a higher risk for hepatitis B, you should be screened for this virus. Talk with your health care provider to find out if you are at risk for hepatitis B infection. Hepatitis C Blood testing is recommended for:  Everyone born from [birth year]  through 1964.  Anyone with known risk factors for hepatitis C.  Sexually Transmitted Diseases (STDs)  You should be screened each year for STDs including gonorrhea and chlamydia if: ? You are sexually active and are younger than 65 years of age. ? You are older than 65 years of age and your health care provider tells you that you are at risk for this type of infection. ? Your sexual activity has changed since you were last screened and you are at an increased risk for chlamydia or gonorrhea. Ask your health care provider if you are at risk.  Talk with your health care provider about whether you are at high risk of being infected with HIV. Your health care provider may recommend a prescription medicine to help prevent HIV infection.  What else can I do?  Schedule regular health, dental, and eye exams.  Stay current with  your vaccines (immunizations).  Do not use any tobacco products, such as cigarettes, chewing tobacco, and e-cigarettes. If you need help quitting, ask your health care provider.  Limit alcohol intake to no more than 2 drinks per day. One drink equals 12 ounces of beer, 5 ounces of wine, or 1 ounces of hard liquor.  Do not use street drugs.  Do not share needles.  Ask your health care provider for help if you need support or information about quitting drugs.  Tell your health care provider if you often feel depressed.  Tell your health care provider if you have ever been abused or do not feel safe at home. This information is not intended to replace advice given to you by your health care provider. Make sure you discuss any questions you have with your health care provider. Document Released: 06/13/2008 Document Revised: 08/14/2016 Document Reviewed: 09/19/2015 Elsevier Interactive Patient Education  [employer].

## 2017-12-17 ENCOUNTER — Ambulatory Visit: Payer: Medicare PPO | Admitting: *Deleted

## 2017-12-29 ENCOUNTER — Ambulatory Visit: Payer: Medicare PPO | Admitting: *Deleted

## 2018-01-02 ENCOUNTER — Other Ambulatory Visit: Payer: Self-pay | Admitting: Family Medicine

## 2018-01-29 ENCOUNTER — Encounter: Payer: Self-pay | Admitting: Family Medicine

## 2018-01-29 ENCOUNTER — Ambulatory Visit: Payer: Medicare PPO | Admitting: Family Medicine

## 2018-01-29 VITALS — BP 108/70 | HR 62 | Temp 97.5°F | Ht 70.0 in | Wt 169.5 lb

## 2018-01-29 DIAGNOSIS — S46811A Strain of other muscles, fascia and tendons at shoulder and upper arm level, right arm, initial encounter: Secondary | ICD-10-CM

## 2018-01-29 NOTE — Progress Notes (Signed)
Pre visit review using our clinic review tool, if applicable. No additional management support is needed unless otherwise documented below in the visit note. 

## 2018-01-29 NOTE — Patient Instructions (Signed)
Heat (pad or rice pillow in microwave) over affected area, 10-15 minutes every 2-3 hours while awake.   OK to take Tylenol 1000 mg (2 extra strength tabs) or 975 mg (3 regular strength tabs) every 6 hours as needed.  In 3-4 weeks, if no improvement, send me a MyChart message and we will get you set up with the physical therapy team.   EXERCISES  RANGE OF MOTION (ROM) AND STRETCHING EXERCISES These exercises may help you when beginning to rehabilitate your injury. While completing these exercises, remember:   Restoring tissue flexibility helps normal motion to return to the joints. This allows healthier, less painful movement and activity.  An effective stretch should be held for at least 30 seconds.  A stretch should never be painful. You should only feel a gentle lengthening or release in the stretched tissue.  ROM - Pendulum  Bend at the waist so that your right / left arm falls away from your body. Support yourself with your opposite hand on a solid surface, such as a table or a countertop.  Your right / left arm should be perpendicular to the ground. If it is not perpendicular, you need to lean over farther. Relax the muscles in your right / left arm and shoulder as much as possible.  Gently sway your hips and trunk so they move your right / left arm without any use of your right / left shoulder muscles.  Progress your movements so that your right / left arm moves side to side, then forward and backward, and finally, both clockwise and counterclockwise.  Complete 10-15 repetitions in each direction. Many people use this exercise to relieve discomfort in their shoulder as well as to gain range of motion. Repeat 2 times. Complete this exercise 3 times per week.  STRETCH - Flexion, Standing  Stand with good posture. With an underhand grip on your right / left hand and an overhand grip on the opposite hand, grasp a broomstick or cane so that your hands are a little more than  shoulder-width apart.  Keeping your right / left elbow straight and shoulder muscles relaxed, push the stick with your opposite hand to raise your right / left arm in front of your body and then overhead. Raise your arm until you feel a stretch in your right / left shoulder, but before you have increased shoulder pain.  Try to avoid shrugging your right / left shoulder as your arm rises by keeping your shoulder blade tucked down and toward your mid-back spine. Hold 30 seconds.  Slowly return to the starting position. Repeat 2 times. Complete this exercise 3 times per week.  STRETCH - Internal Rotation  Place your right / left hand behind your back, palm-up.  Throw a towel or belt over your opposite shoulder. Grasp the towel/belt with your right / left hand.  While keeping an upright posture, gently pull up on the towel/belt until you feel a stretch in the front of your right / left shoulder.  Avoid shrugging your right / left shoulder as your arm rises by keeping your shoulder blade tucked down and toward your mid-back spine.  Hold 30. Release the stretch by lowering your opposite hand. Repeat 2 times. Complete this exercise 3 times per week.  STRETCH - External Rotation and Abduction  Stagger your stance through a doorframe. It does not matter which foot is forward.  As instructed by your physician, physical therapist or athletic trainer, place your hands: ? And forearms above your  head and on the door frame. ? And forearms at head-height and on the door frame. ? At elbow-height and on the door frame.  Keeping your head and chest upright and your stomach muscles tight to prevent over-extending your low-back, slowly shift your weight onto your front foot until you feel a stretch across your chest and/or in the front of your shoulders.  Hold 30 seconds. Shift your weight to your back foot to release the stretch. Repeat 2 times. Complete this stretch 3 times per week.    STRENGTHENING EXERCISES  These exercises may help you when beginning to rehabilitate your injury. They may resolve your symptoms with or without further involvement from your physician, physical therapist or athletic trainer. While completing these exercises, remember:   Muscles can gain both the endurance and the strength needed for everyday activities through controlled exercises.  Complete these exercises as instructed by your physician, physical therapist or athletic trainer. Progress the resistance and repetitions only as guided.  You may experience muscle soreness or fatigue, but the pain or discomfort you are trying to eliminate should never worsen during these exercises. If this pain does worsen, stop and make certain you are following the directions exactly. If the pain is still present after adjustments, discontinue the exercise until you can discuss the trouble with your clinician.  If advised by your physician, during your recovery, avoid activity or exercises which involve actions that place your right / left hand or elbow above your head or behind your back or head. These positions stress the tissues which are trying to heal.  STRENGTH - Scapular Depression and Adduction  With good posture, sit on a firm chair. Supported your arms in front of you with pillows, arm rests or a table top. Have your elbows in line with the sides of your body.  Gently draw your shoulder blades down and toward your mid-back spine. Gradually increase the tension without tensing the muscles along the top of your shoulders and the back of your neck.  Hold for 3 seconds. Slowly release the tension and relax your muscles completely before completing the next repetition.  After you have practiced this exercise, remove the arm support and complete it in standing as well as sitting. Repeat 2 times. Complete this exercise 3 times per week.   STRENGTH - External Rotators  Secure a rubber exercise  band/tubing to a fixed object so that it is at the same height as your right / left elbow when you are standing or sitting on a firm surface.  Stand or sit so that the secured exercise band/tubing is at your side that is not injured.  Bend your elbow 90 degrees. Place a folded towel or small pillow under your right / left arm so that your elbow is a few inches away from your side.  Keeping the tension on the exercise band/tubing, pull it away from your body, as if pivoting on your elbow. Be sure to keep your body steady so that the movement is only coming from your shoulder rotating.  Hold 3 seconds. Release the tension in a controlled manner as you return to the starting position. Repeat 2 times. Complete this exercise 3 times per week.   STRENGTH - Supraspinatus  Stand or sit with good posture. Grasp a 2-3 lb weight or an exercise band/tubing so that your hand is "thumbs-up," like when you shake hands.  Slowly lift your right / left hand from your thigh into the air, traveling about 30  degrees from straight out at your side. Lift your hand to shoulder height or as far as you can without increasing any shoulder pain. Initially, many people do not lift their hands above shoulder height.  Avoid shrugging your right / left shoulder as your arm rises by keeping your shoulder blade tucked down and toward your mid-back spine.  Hold for 3 seconds. Control the descent of your hand as you slowly return to your starting position. Repeat 2 times. Complete this exercise 3 times per week.   STRENGTH - Shoulder Extensors  Secure a rubber exercise band/tubing so that it is at the height of your shoulders when you are either standing or sitting on a firm arm-less chair.  With a thumbs-up grip, grasp an end of the band/tubing in each hand. Straighten your elbows and lift your hands straight in front of you at shoulder height. Step back away from the secured end of band/tubing until it becomes  tense.  Squeezing your shoulder blades together, pull your hands down to the sides of your thighs. Do not allow your hands to go behind you.  Hold for 3 seconds. Slowly ease the tension on the band/tubing as you reverse the directions and return to the starting position. Repeat 2 times. Complete this exercise 3 times per week.   STRENGTH - Scapular Retractors  Secure a rubber exercise band/tubing so that it is at the height of your shoulders when you are either standing or sitting on a firm arm-less chair.  With a palm-down grip, grasp an end of the band/tubing in each hand. Straighten your elbows and lift your hands straight in front of you at shoulder height. Step back away from the secured end of band/tubing until it becomes tense.  Squeezing your shoulder blades together, draw your elbows back as you bend them. Keep your upper arm lifted away from your body throughout the exercise.  Hold 3 seconds. Slowly ease the tension on the band/tubing as you reverse the directions and return to the starting position. Repeat 2 times. Complete this exercise 3 times per week.  STRENGTH - Scapular Depressors  Find a sturdy chair without wheels, such as a from a dining room table.  Keeping your feet on the floor, lift your bottom from the seat and lock your elbows.  Keeping your elbows straight, allow gravity to pull your body weight down. Your shoulders will rise toward your ears.  Raise your body against gravity by drawing your shoulder blades down your back, shortening the distance between your shoulders and ears. Although your feet should always maintain contact with the floor, your feet should progressively support less body weight as you get stronger.  Hold 3 seconds. In a controlled and slow manner, lower your body weight to begin the next repetition. Repeat 2 times. Complete this exercise 3 times per week.   This information is not intended to replace advice given to you by your health  care provider. Make sure you discuss any questions you have with your health care provider.  Document Released: 10/30/2005 Document Revised: 01/06/2015 Document Reviewed: 03/30/2009 Elsevier Interactive Patient Education [employer].

## 2018-01-29 NOTE — Progress Notes (Signed)
Musculoskeletal Exam  Patient: Ray PeersDouglas Waid DOB: 14-Jul-1951  DOS: 01/29/2018  SUBJECTIVE:  Chief Complaint:   Chief Complaint  Patient presents with  . Arm Pain    right    Ray PeersDouglas Kirkland is a 67 y.o.  male for evaluation and treatment of R arm pain.   Onset:  2 weeks ago. Was pulling a pallet at [employer] and felt a pull/snap.  Location: R upper arm/shoulder Character:  stabbing  Progression of issue:  Unchanged Associated symptoms: no swelling, bruising, or redness, ROM nml Hurts when he pulls Treatment: to date has been None.   Neurovascular symptoms: no  ROS: Musculoskeletal/Extremities: +R arm pain  Past Medical History:  Diagnosis Date  . Alcohol addiction (HCC)   . Allergy   . Colon polyps    Benign, next colonoscopy due in 2023  . History of chicken pox   . Hyperlipidemia     Objective: VITAL SIGNS: BP 108/70 (BP Location: Left Arm, Patient Position: Sitting, Cuff Size: Normal)   Pulse 62   Temp (!) 97.5 F (36.4 C) (Oral)   Ht 5\' 10"  (1.778 m)   Wt 169 lb 8 oz (76.9 kg)   SpO2 97%   BMI 24.32 kg/m  Constitutional: Well formed, well developed. No acute distress. Cardiovascular: Brisk cap refill Thorax & Lungs: No accessory muscle use Musculoskeletal: R shoulder.   Normal active range of motion: yes.   Normal passive range of motion: yes Tenderness to palpation: yes; over R deltoid, insertion Deformity: no Ecchymosis: no Tests positive: None Tests negative: Neer's, cross over, lift off Pain with resisted abduction Neurologic: Normal sensory function. No focal deficits noted. DTR's equal and symmetry in UE's. No clonus. Psychiatric: Normal mood. Age appropriate judgment and insight. Alert & oriented x 3.    Assessment:  Strain of right deltoid muscle, initial encounter  Plan: Tylenol, heat, home stretches exercises, activity as tolerated. F/u prn, message in 4 weeks if no improvement, will start PT. The patient voiced understanding and  agreement to the plan.   Jilda Rocheicholas Paul [place], DO 01/29/18  2:30 PM

## 2018-02-06 ENCOUNTER — Encounter (HOSPITAL_COMMUNITY): Payer: Self-pay | Admitting: *Deleted

## 2018-02-06 ENCOUNTER — Emergency Department (HOSPITAL_COMMUNITY): Payer: Medicare PPO

## 2018-02-06 DIAGNOSIS — R0789 Other chest pain: Secondary | ICD-10-CM | POA: Diagnosis present

## 2018-02-06 DIAGNOSIS — Z5321 Procedure and treatment not carried out due to patient leaving prior to being seen by health care provider: Secondary | ICD-10-CM | POA: Insufficient documentation

## 2018-02-06 LAB — I-STAT TROPONIN, ED: Troponin i, poc: 0 ng/mL (ref 0.00–0.08)

## 2018-02-06 LAB — CBC
HEMATOCRIT: 41.8 % (ref 39.0–52.0)
HEMOGLOBIN: 14.4 g/dL (ref 13.0–17.0)
MCH: 30.2 pg (ref 26.0–34.0)
MCHC: 34.4 g/dL (ref 30.0–36.0)
MCV: 87.6 fL (ref 78.0–100.0)
Platelets: 314 10*3/uL (ref 150–400)
RBC: 4.77 MIL/uL (ref 4.22–5.81)
RDW: 14.3 % (ref 11.5–15.5)
WBC: 10 10*3/uL (ref 4.0–10.5)

## 2018-02-06 NOTE — ED Triage Notes (Signed)
Pt states that he got home from work and sat on the couch, had onset of central chest burning, pain 2/10 when it started and has maintained. Took 2 baby asa PTA.

## 2018-02-07 ENCOUNTER — Emergency Department (HOSPITAL_COMMUNITY)
Admission: EM | Admit: 2018-02-07 | Discharge: 2018-02-07 | Disposition: A | Discharge status: Home / Self Care | Payer: Medicare PPO | Attending: Emergency Medicine | Admitting: Emergency Medicine

## 2018-02-07 LAB — BASIC METABOLIC PANEL
Anion gap: 9 (ref 5–15)
BUN: 17 mg/dL (ref 6–20)
CALCIUM: 9 mg/dL (ref 8.9–10.3)
CO2: 24 mmol/L (ref 22–32)
Chloride: 104 mmol/L (ref 101–111)
Creatinine, Ser: 0.85 mg/dL (ref 0.61–1.24)
GFR calc Af Amer: >60 mL/min (ref >60)
GLUCOSE: 104 mg/dL — AB (ref 65–99)
POTASSIUM: 3.7 mmol/L (ref 3.5–5.1)
Sodium: 137 mmol/L (ref 135–145)

## 2018-02-07 NOTE — ED Notes (Signed)
Pt stated they were leaving and turned stickers into registration staff.

## 2018-07-03 ENCOUNTER — Ambulatory Visit (INDEPENDENT_AMBULATORY_CARE_PROVIDER_SITE_OTHER): Payer: Medicare Other | Admitting: Family Medicine

## 2018-07-03 ENCOUNTER — Encounter: Payer: Self-pay | Admitting: Family Medicine

## 2018-07-03 VITALS — BP 116/70 | HR 79 | Temp 97.9°F | Resp 16 | Ht 69.8 in | Wt 162.6 lb

## 2018-07-03 DIAGNOSIS — Z125 Encounter for screening for malignant neoplasm of prostate: Secondary | ICD-10-CM | POA: Diagnosis not present

## 2018-07-03 DIAGNOSIS — Z23 Encounter for immunization: Secondary | ICD-10-CM

## 2018-07-03 DIAGNOSIS — Z0000 Encounter for general adult medical examination without abnormal findings: Secondary | ICD-10-CM

## 2018-07-03 LAB — LIPID PANEL
CHOL/HDL RATIO: 4.7 (calc) (ref <5.0)
Cholesterol: 221 mg/dL — ABNORMAL HIGH (ref <200)
HDL: 47 mg/dL (ref >40)
LDL Cholesterol (Calc): 143 mg/dL (calc) — ABNORMAL HIGH
NON-HDL CHOLESTEROL (CALC): 174 mg/dL — AB (ref <130)
TRIGLYCERIDES: 171 mg/dL — AB (ref <150)

## 2018-07-03 LAB — COMPREHENSIVE METABOLIC PANEL
AG Ratio: 2 (calc) (ref 1.0–2.5)
ALBUMIN MSPROF: 4.4 g/dL (ref 3.6–5.1)
ALT: 21 U/L (ref 9–46)
AST: 22 U/L (ref 10–35)
Alkaline phosphatase (APISO): 93 U/L (ref 40–115)
BILIRUBIN TOTAL: 0.7 mg/dL (ref 0.2–1.2)
BUN: 13 mg/dL (ref 7–25)
CO2: 27 mmol/L (ref 20–32)
Calcium: 9.4 mg/dL (ref 8.6–10.3)
Chloride: 104 mmol/L (ref 98–110)
Creat: 0.94 mg/dL (ref 0.70–1.25)
GLUCOSE: 86 mg/dL (ref 65–99)
Globulin: 2.2 g/dL (calc) (ref 1.9–3.7)
POTASSIUM: 4.5 mmol/L (ref 3.5–5.3)
Sodium: 140 mmol/L (ref 135–146)
Total Protein: 6.6 g/dL (ref 6.1–8.1)

## 2018-07-03 LAB — PSA: PSA: 0.6 ng/mL (ref <=4.0)

## 2018-07-03 NOTE — Progress Notes (Signed)
Chief Complaint  Patient presents with  . Annual Exam    Well Male Ray PeersDouglas Stein is here for a complete physical.   His last physical was >1 year ago.  Current diet: in general, a "pretty good" diet.   Current exercise: walking daily Weight trend: stable Does pt snore? No. Daytime fatigue? No. Seat belt? Yes.    Health maintenance Shingrix- No Colonoscopy- Yes Tetanus- Yes Hep C- Yes Pneumonia vaccine- Due  Past Medical History:  Diagnosis Date  . Alcohol addiction (HCC)   . Allergy   . Colon polyps    Benign, next colonoscopy due in 2023  . History of chicken pox   . Hyperlipidemia      Past Surgical History:  Procedure Laterality Date  . HERNIA REPAIR    . WISDOM TOOTH EXTRACTION      Medications  Current Outpatient Medications on File Prior to Visit  Medication Sig Dispense Refill  . atorvastatin (LIPITOR) 40 MG tablet TAKE 1 TABLET BY MOUTH EVERY DAY 90 tablet 1   Allergies Allergies  Allergen Reactions  . Penicillins Rash    Family History Family History  Problem Relation Age of Onset  . Arthritis Mother   . Diabetes Mother   . Diabetes Father   . Cancer Maternal Grandfather        Colon    Review of Systems: Constitutional:  no fevers or chills Eye:  no recent significant change in vision Ear/Nose/Mouth/Throat:  Ears:  no recent hearing loss Nose/Mouth/Throat:  no complaints of nasal congestion or sore throat Cardiovascular:  no chest pain, no palpitations Respiratory:  no cough and no shortness of breath Gastrointestinal:  no abdominal pain, no change in bowel habits GU:  Male: negative for dysuria, frequency, and incontinence and negative for prostate symptoms Musculoskeletal/Extremities:  no pain, redness, or swelling of the joints Integumentary (Skin):  no abnormal skin lesions reported Neurologic:  no headaches, Endocrine:  No unexpected weight changes Hematologic/Lymphatic:  no areas of easy bruising  Exam BP 116/70 (BP Location:  Left Arm, Cuff Size: Normal)   Pulse 79   Temp 97.9 F (36.6 C) (Oral)   Resp 16   Ht 5' 9.8" (1.773 m)   Wt 162 lb 9.6 oz (73.8 kg)   SpO2 97%   BMI 23.46 kg/m  General:  well developed, well nourished, in no apparent distress Skin:  no significant moles, warts, or growths Head:  no masses, lesions, or tenderness Eyes:  pupils equal and round, sclera anicteric without injection Ears:  canals without lesions, TMs shiny without retraction, no obvious effusion, no erythema Nose:  nares patent, septum midline, mucosa normal Throat/Pharynx:  lips and gingiva without lesion; tongue and uvula midline; non-inflamed pharynx; no exudates or postnasal drainage Neck: neck supple without adenopathy, thyromegaly, or masses Lungs:  clear to auscultation, breath sounds equal bilaterally, no respiratory distress Cardio:  regular rate and rhythm, no LE edema or bruits Abdomen:  abdomen soft, nontender; bowel sounds normal; no masses or organomegaly Genital (male): circumcised penis, no lesions or discharge; testes present bilaterally without masses or tenderness Rectal: Deferred Musculoskeletal:  symmetrical muscle groups noted without atrophy or deformity Extremities:  no clubbing, cyanosis, or edema, no deformities, no skin discoloration Neuro:  gait normal; deep tendon reflexes normal and symmetric Psych: well oriented with normal range of affect and appropriate judgment/insight  Assessment and Plan  Well adult exam - Plan: Comprehensive metabolic panel, Lipid panel  Screening for malignant neoplasm of prostate - Plan: PSA  Well 67 y.o. male. Counseled on diet and exercise. Counseled on risks and benefits of prostate CA screening with PSA and he agreed to undergo screening. Other orders as above. Follow up in 6 mo, 2-6 mo for 2nd Shingrix..  The patient voiced understanding and agreement to the plan.  Jilda Roche [place], DO 07/03/18 3:58 PM

## 2018-07-03 NOTE — Addendum Note (Signed)
Addended by: Thelma BargeICHARDSON, Braydon Kullman D on: 07/03/2018 04:08 PM   Modules accepted: Orders

## 2018-07-03 NOTE — Addendum Note (Signed)
Addended by: Verdie ShireBAYNES, Alynah Schone M on: 07/03/2018 04:15 PM   Modules accepted: Orders

## 2018-07-03 NOTE — Patient Instructions (Signed)
Keep up the good work.   1-2 days to get the results of your lab results.  Let us know if you need anything.

## 2018-08-25 ENCOUNTER — Other Ambulatory Visit: Payer: Self-pay | Admitting: Family Medicine

## 2018-12-14 ENCOUNTER — Ambulatory Visit: Payer: Medicare PPO | Admitting: *Deleted

## 2018-12-15 ENCOUNTER — Ambulatory Visit: Payer: 59

## 2018-12-15 ENCOUNTER — Ambulatory Visit: Payer: Medicare Other | Admitting: *Deleted

## 2019-01-01 ENCOUNTER — Other Ambulatory Visit: Payer: Self-pay | Admitting: Family Medicine

## 2019-01-04 ENCOUNTER — Ambulatory Visit: Payer: 59 | Admitting: Family Medicine

## 2019-05-19 IMAGING — CR DG CHEST 2V
2 series · 2 of 2 positions shown · non-contrast
Comparison: None.

CLINICAL DATA: Chest pain

EXAM:
CHEST  2 VIEW

[w chest pa]
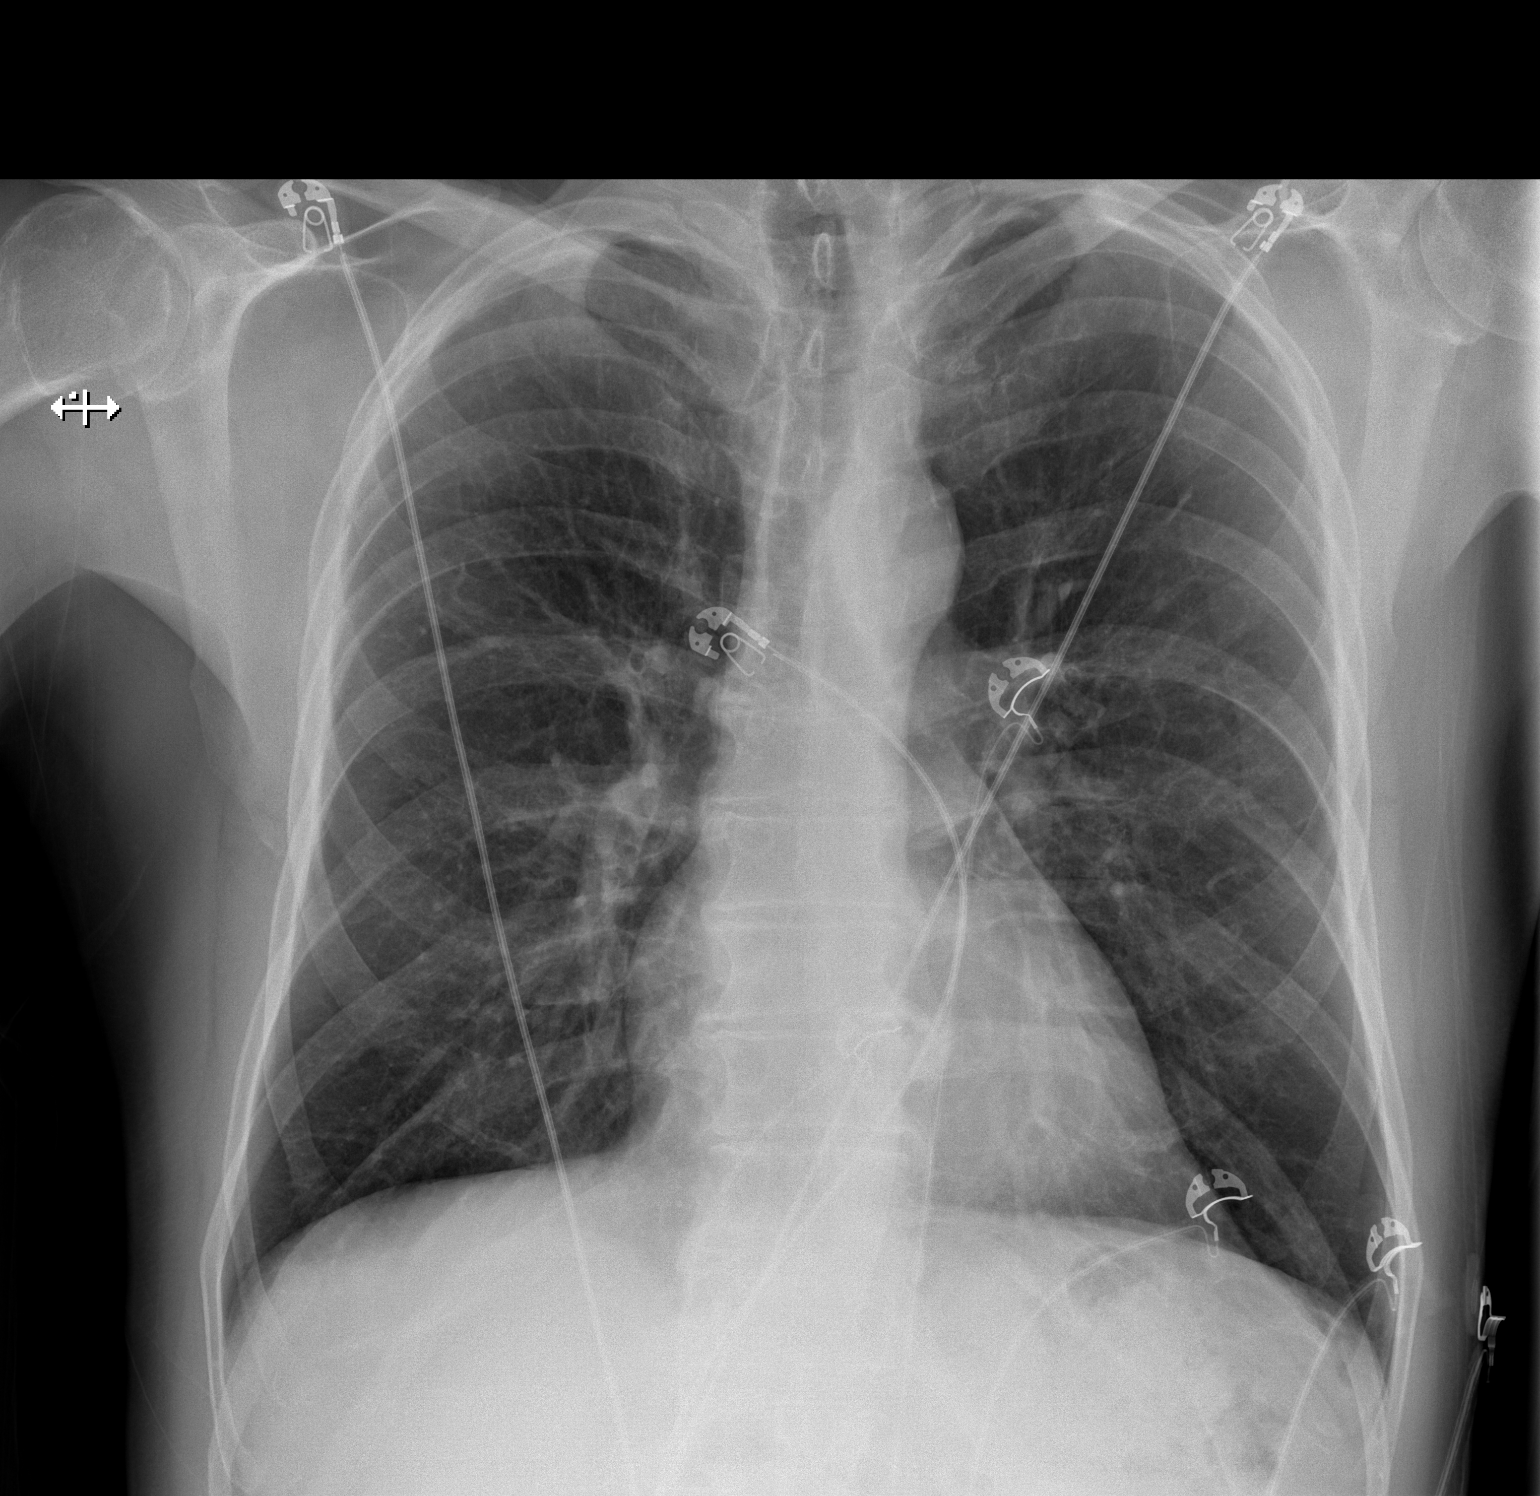

[w chest lat]
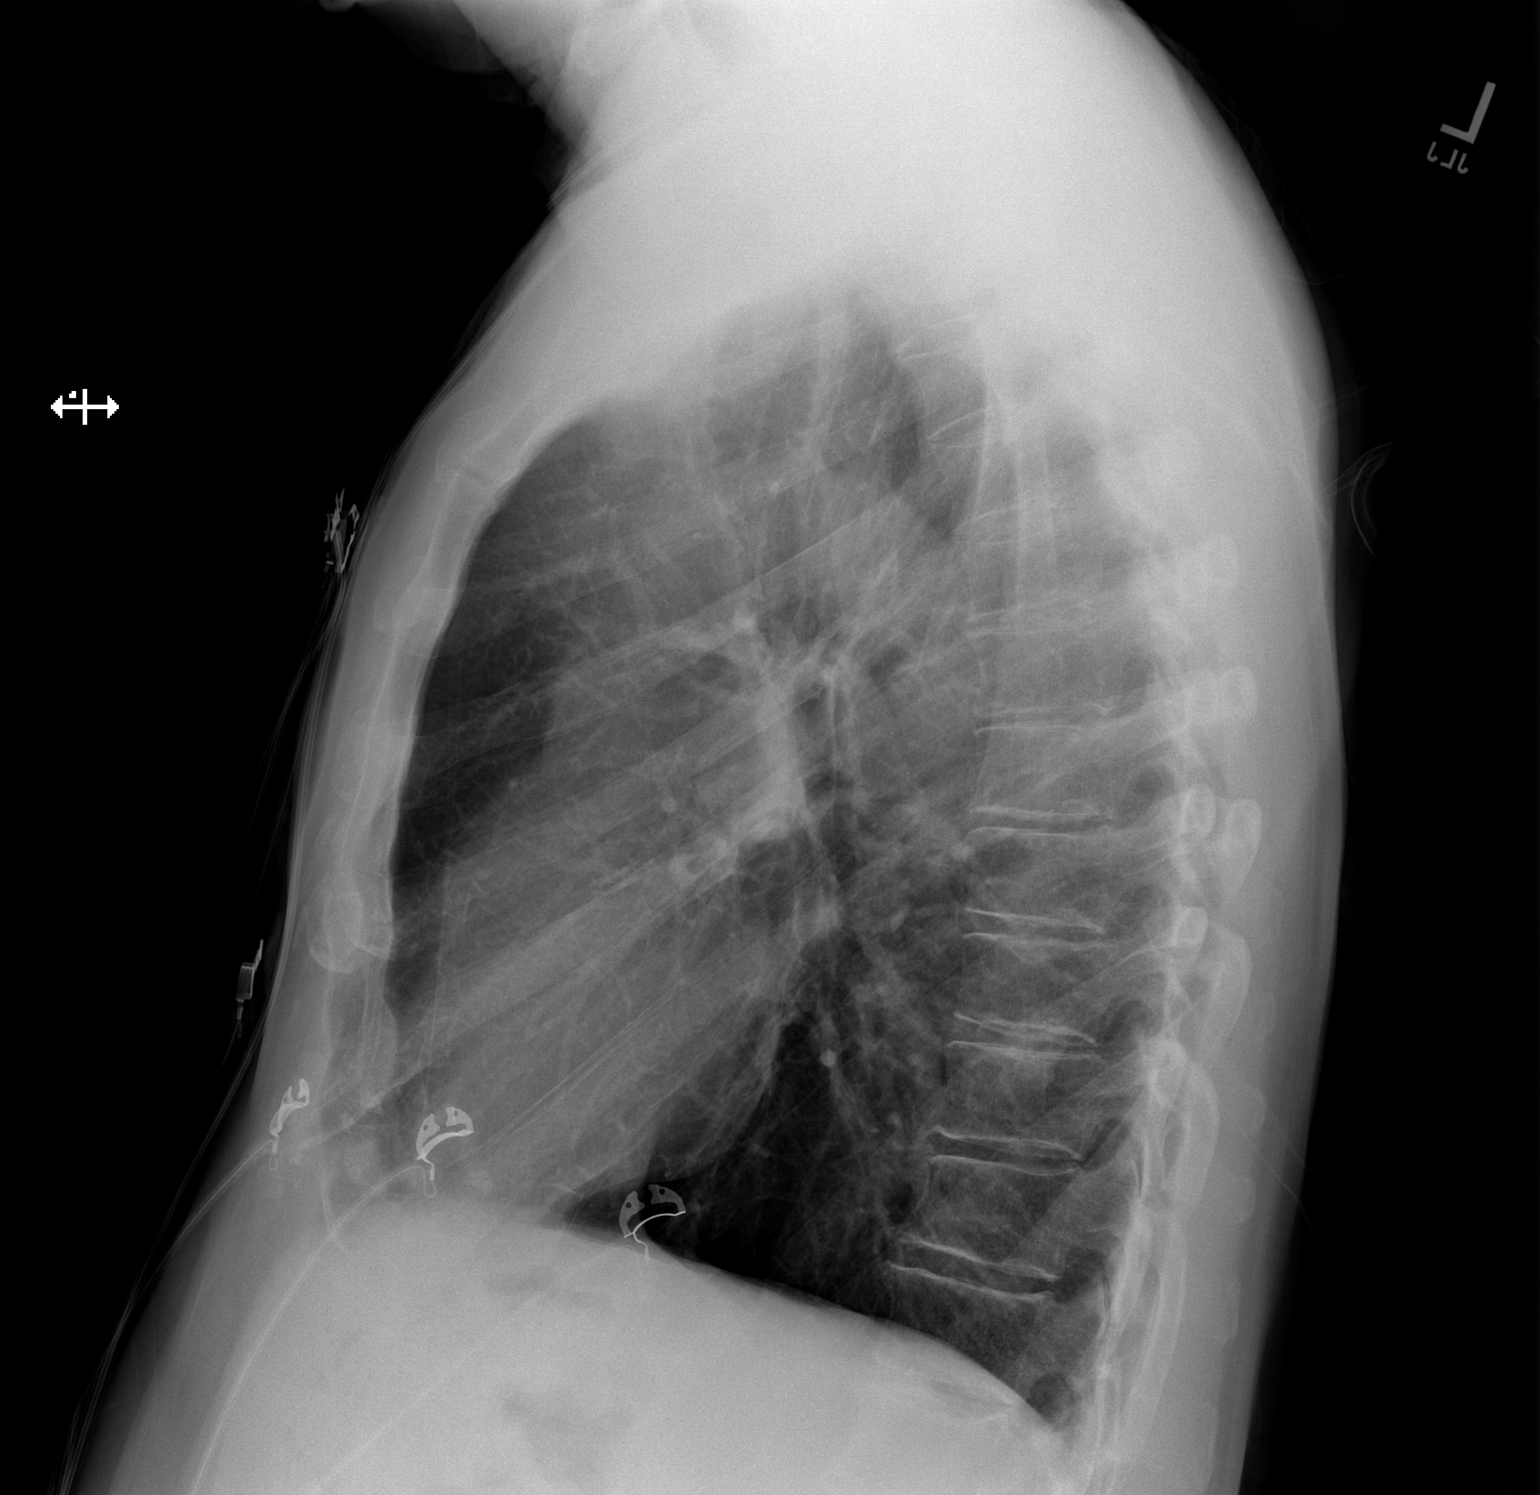

[2 of 2 positions shown; findings below may reference images not displayed]

FINDINGS: No focal pulmonary infiltrate or effusion. Normal cardiomediastinal
silhouette with aortic atherosclerosis. No pneumothorax.
IMPRESSION: No active cardiopulmonary disease.

## 2019-12-09 ENCOUNTER — Telehealth: Payer: Self-pay | Admitting: Family Medicine

## 2019-12-09 NOTE — Telephone Encounter (Signed)
Called patient to schedule AWV, but no answer. Will try to call patient back at later time. SF °
